# Patient Record
Sex: Female | Born: 1935
Health system: Southern US, Community
[De-identification: ages and names within clinical notes are randomized; demographics above are authoritative.]

---

## 2013-01-17 ENCOUNTER — Observation Stay: Payer: Self-pay | Admitting: Internal Medicine

## 2013-01-17 LAB — COMPREHENSIVE METABOLIC PANEL
ALBUMIN: 2.7 g/dL — AB (ref 3.4–5.0)
Alkaline Phosphatase: 118 U/L — ABNORMAL HIGH
Anion Gap: 3 — ABNORMAL LOW (ref 7–16)
BUN: 22 mg/dL — ABNORMAL HIGH (ref 7–18)
Bilirubin,Total: 0.2 mg/dL (ref 0.2–1.0)
CHLORIDE: 106 mmol/L (ref 98–107)
Calcium, Total: 8.7 mg/dL (ref 8.5–10.1)
Co2: 28 mmol/L (ref 21–32)
Creatinine: 1.38 mg/dL — ABNORMAL HIGH (ref 0.60–1.30)
EGFR (African American): 41 — ABNORMAL LOW
GFR CALC NON AF AMER: 35 — AB
Glucose: 108 mg/dL — ABNORMAL HIGH (ref 65–99)
Osmolality: 278 (ref 275–301)
POTASSIUM: 3.8 mmol/L (ref 3.5–5.1)
SGOT(AST): 28 U/L (ref 15–37)
SGPT (ALT): 17 U/L (ref 12–78)
Sodium: 137 mmol/L (ref 136–145)
Total Protein: 7.3 g/dL (ref 6.4–8.2)

## 2013-01-17 LAB — CBC
HCT: 35.4 % (ref 35.0–47.0)
HGB: 11.8 g/dL — AB (ref 12.0–16.0)
MCH: 31.5 pg (ref 26.0–34.0)
MCHC: 33.2 g/dL (ref 32.0–36.0)
MCV: 95 fL (ref 80–100)
PLATELETS: 208 10*3/uL (ref 150–440)
RBC: 3.73 10*6/uL — ABNORMAL LOW (ref 3.80–5.20)
RDW: 13.9 % (ref 11.5–14.5)
WBC: 7.4 10*3/uL (ref 3.6–11.0)

## 2013-01-17 LAB — TROPONIN I

## 2013-01-17 LAB — CK TOTAL AND CKMB (NOT AT ARMC)
CK, Total: 460 U/L — ABNORMAL HIGH (ref 21–215)
CK-MB: 0.8 ng/mL (ref 0.5–3.6)

## 2013-01-17 LAB — TSH: Thyroid Stimulating Horm: 1.08 u[IU]/mL

## 2013-01-18 DIAGNOSIS — R079 Chest pain, unspecified: Secondary | ICD-10-CM

## 2013-01-18 LAB — LIPID PANEL
Cholesterol: 133 mg/dL (ref 0–200)
HDL: 41 mg/dL (ref 40–60)
LDL CHOLESTEROL, CALC: 81 mg/dL (ref 0–100)
TRIGLYCERIDES: 54 mg/dL (ref 0–200)
VLDL CHOLESTEROL, CALC: 11 mg/dL (ref 5–40)

## 2013-01-18 LAB — URINALYSIS, COMPLETE
Bilirubin,UR: NEGATIVE
Blood: NEGATIVE
Glucose,UR: NEGATIVE mg/dL (ref 0–75)
Hyaline Cast: 3
Ketone: NEGATIVE
Leukocyte Esterase: NEGATIVE
NITRITE: NEGATIVE
PROTEIN: NEGATIVE
Ph: 5 (ref 4.5–8.0)
RBC,UR: 5 /HPF (ref 0–5)
Specific Gravity: 1.019 (ref 1.003–1.030)
WBC UR: 4 /HPF (ref 0–5)

## 2013-01-18 LAB — TROPONIN I
Troponin-I: 0.03 ng/mL
Troponin-I: 0.03 ng/mL

## 2013-01-18 LAB — BASIC METABOLIC PANEL
Anion Gap: 4 — ABNORMAL LOW (ref 7–16)
BUN: 20 mg/dL — ABNORMAL HIGH (ref 7–18)
CHLORIDE: 110 mmol/L — AB (ref 98–107)
CO2: 26 mmol/L (ref 21–32)
Calcium, Total: 8.6 mg/dL (ref 8.5–10.1)
Creatinine: 1.16 mg/dL (ref 0.60–1.30)
EGFR (Non-African Amer.): 44 — ABNORMAL LOW
GFR CALC AF AMER: 50 — AB
GLUCOSE: 88 mg/dL (ref 65–99)
Osmolality: 281 (ref 275–301)
POTASSIUM: 3.7 mmol/L (ref 3.5–5.1)
SODIUM: 140 mmol/L (ref 136–145)

## 2013-01-18 LAB — CK TOTAL AND CKMB (NOT AT ARMC)
CK, TOTAL: 366 U/L — AB (ref 21–215)
CK, Total: 439 U/L — ABNORMAL HIGH (ref 21–215)
CK-MB: 1 ng/mL (ref 0.5–3.6)
CK-MB: 1.1 ng/mL (ref 0.5–3.6)

## 2013-01-21 ENCOUNTER — Emergency Department: Payer: Self-pay | Admitting: Emergency Medicine

## 2013-01-21 LAB — CK TOTAL AND CKMB (NOT AT ARMC)
CK, Total: 208 U/L (ref 21–215)
CK-MB: 1 ng/mL (ref 0.5–3.6)

## 2013-01-21 LAB — COMPREHENSIVE METABOLIC PANEL
ALK PHOS: 120 U/L — AB
ALT: 20 U/L (ref 12–78)
Albumin: 2.8 g/dL — ABNORMAL LOW (ref 3.4–5.0)
Anion Gap: 3 — ABNORMAL LOW (ref 7–16)
BUN: 17 mg/dL (ref 7–18)
Bilirubin,Total: 0.2 mg/dL (ref 0.2–1.0)
CO2: 29 mmol/L (ref 21–32)
Calcium, Total: 9.2 mg/dL (ref 8.5–10.1)
Chloride: 102 mmol/L (ref 98–107)
Creatinine: 1.11 mg/dL (ref 0.60–1.30)
EGFR (African American): 53 — ABNORMAL LOW
EGFR (Non-African Amer.): 46 — ABNORMAL LOW
GLUCOSE: 78 mg/dL (ref 65–99)
OSMOLALITY: 269 (ref 275–301)
Potassium: 4.2 mmol/L (ref 3.5–5.1)
SGOT(AST): 34 U/L (ref 15–37)
Sodium: 134 mmol/L — ABNORMAL LOW (ref 136–145)
Total Protein: 7.6 g/dL (ref 6.4–8.2)

## 2013-01-21 LAB — CBC
HCT: 36.4 % (ref 35.0–47.0)
HGB: 12.2 g/dL (ref 12.0–16.0)
MCH: 31.5 pg (ref 26.0–34.0)
MCHC: 33.6 g/dL (ref 32.0–36.0)
MCV: 94 fL (ref 80–100)
PLATELETS: 229 10*3/uL (ref 150–440)
RBC: 3.87 10*6/uL (ref 3.80–5.20)
RDW: 13.9 % (ref 11.5–14.5)
WBC: 5.7 10*3/uL (ref 3.6–11.0)

## 2013-01-21 LAB — TROPONIN I: Troponin-I: <0.02 ng/mL

## 2014-05-08 NOTE — Discharge Summary (Signed)
PATIENT NAME:  Michelle Strong, Michelle Strong MR#:  [id number] DATE OF BIRTH:  1927/05/29  DATE OF ADMISSION:  01/17/2013 DATE OF DISCHARGE:  01/18/2013  PRIMARY CARE PHYSICIAN:  Nonlocal.    DISCHARGE DIAGNOSES:  Atypical chest pain, supraventricular tachycardia, acute renal failure, hiatal hernia, dementia.   CONDITION: Stable.   CODE STATUS: Full code.   HOME MEDICATIONS: Aspirin 81 mg p.o. daily.   The patient needs home health with nurse aide 24 hours' assistance according to physical therapy recommendations.     DIET: Regular diet.   ACTIVITY: As tolerated.   FOLLOWUP CARE: Follow with PCP within 1 to 2 weeks.   REASON FOR ADMISSION: Chest pain.   HOSPITAL COURSE: The patient is an 81 year old African American female with a history of dementia and bladder spasm, presented in the ED with chest pain and shortness of breath. The patient is demented.  She was noted to have SVT at 150, was treated with adenosine 60 mg 1 dose and converted to normal sinus rhythm. For detailed history and physical examination, please refer to the admission note dictated by Dr. Clint GuyHower. On admission date, the patient's BUN 22, creatinine 1.3. Troponin is negative. After admission, the patient's troponin has been negative for 3 sets. Lipid panel is normal. The patient has been treated with aspirin and a statin. The patient only has mild epigastric discomfort which is possibly due to hiatal hernia according to patient's chest x-ray.  1.  SVT improved after adenosine. 2.  Acute renal failure. The patient's renal function became normal after IV fluid support. Creatinine decreased from 1.38 to 1.16.  3.  Dementia and weakness. The patient is demented but has no complaints. Vital signs are stable. According to physical therapy evaluation, the patient needs 24 hours' assistance. The patient is clinically stable, will be discharged to home with home health today. I discussed the patient's discharge plan with patient's nurse.   TIME  SPENT: About 35 minutes.   ____________________________ Shaune PollackQing Laniqua Torrens, MD qc:cs D: 01/18/2013 15:31:03 ET T: 01/18/2013 20:13:55 ET JOB#: [id number]  cc: Shaune PollackQing Nyomie Ehrlich, MD, <Dictator> Shaune PollackQING Xana Bradt MD ELECTRONICALLY SIGNED 01/19/2013 16:53

## 2014-05-08 NOTE — H&P (Signed)
PATIENT NAME:  Michelle Strong, YUNG MR#:  [id number] DATE OF BIRTH:  12/18/1927  DATE OF ADMISSION:  01/17/2013  REFERRING PHYSICIAN:  Dr. Kerman Passey.   PRIMARY CARE PHYSICIAN:  [place] [facility] [place], nonlocal.   CHIEF COMPLAINT:  Chest pain.   HISTORY OF PRESENT ILLNESS:  An 81 year old African American female with history of dementia as well as bladder spasms presenting with chest pain and shortness of breath.  The patient unable to provide any reliable information, however per her description she describes the chest pain has been intermittent for weeks, however her family who are at bedside states that she had chest pain complaint only of today, left chest in location, nonradiating.  She described it only as discomfort, 6 out of 10 in intensity, worse with movement.  No relieving factors with associated shortness of breath and palpitations.  With the above symptoms called EMS.  On arrival to the Emergency Department found to be in SVT with heart rate in the 150s.  She was given adenosine 6 mg x 1 and converted to normal sinus rhythm.  Currently, the patient is without complaints and has no idea why she is in the hospital.   REVIEW OF SYSTEMS:  Unable to be reliably obtained secondary to patient's baseline mental status.   PAST MEDICAL HISTORY:  States only of bladder spasms.   SOCIAL HISTORY:  Positive for every day tobacco use.  Occasional alcohol usage.  Denies any drug usage.  Lives in [place] according to her.   FAMILY HISTORY:  Denies any knowledge of cardiovascular disease, but once again, the patient is demented, unsure how reliable this information is.   ALLERGIES:  No known allergies.   HOME MEDICATIONS:  None.   PHYSICAL EXAMINATION: VITAL SIGNS:  Temperature 98.2, heart rate 99, respirations 18, blood pressure 135/79, saturating 95% on room air.  Weight 39 kg, BMI of 15.8.  GENERAL:  Malnourished, frail-appearing African American female, currently in no acute distress.  HEAD:   Normocephalic, atraumatic.  EYES:  Pupils equal, round, reactive to light.  Extraocular muscles intact.  No scleral icterus.  MOUTH:  Dry mucosal membranes.  Dentition intact.  No abscess noted.  EAR, NOSE, THROAT:  Throat clear without exudates.  No external lesions.  NECK:  Supple.  No thyromegaly.  No nodules.  No JVD.  PULMONARY:  Clear to auscultation bilaterally with no wheezes, rubs or rhonchi.  No use of accessory muscles.  Good respiratory effort.  CHEST:  Nontender to palpation.  CARDIOVASCULAR:  S1, S2, regular rate and rhythm.  No murmurs, rubs, or gallops.  No edema.  Pedal pulses 2+ bilaterally.  GASTROINTESTINAL:  Soft, nontender, nondistended.  No masses.  Positive bowel sounds.  No hepatosplenomegaly.  MUSCULOSKELETAL:  No swelling, clubbing, edema.  Range of motion full in all extremities.  NEUROLOGIC:  Cranial nerves II through XII intact.  No gross focal neurological deficits.  Sensation intact.  Reflexes intact.  SKIN:  No ulcerations, lesions, rashes or cyanosis.  Skin warm, dry.  Turgor is intact.  PSYCHIATRIC:  Mood and affect within normal limits.  She is awake, alert to person, however not to place or time.  She has a circumferential speech pattern.  Insight and judgment poor at best.   LABORATORY DATA:  Sodium 137, potassium 3.8, chloride 106, bicarb 28, BUN 22, creatinine 1.3, glucose 108, albumin 2.7, alk phos 118.  Remainder of LFTs within normal limits.  CK 460, CK-MB 0.8, troponin 0.02.  WBC 7.4, hemoglobin 11.8, platelets 208.  Initial EKG on arrival reveals narrow complex tachycardia, heart rate of 150s.  After adenosine, sinus tachycardia, heart rate 122 with LVH.  There is repolarization abnormality in leads V5 and 6.   ASSESSMENT AND PLAN:  An 81 year old African American female with history of dementia and bladder spasms, presenting with chest pain, shortness of breath, found to be in supraventricular tachycardia.  1.  Chest pain.  Admit to observation under  telemetry.  Trend cardiac enzymes.  Check her lipids.  She has been given aspirin.  Continue that, as well as start a statin therapy.  2.  Supraventricular tachycardia.  She is currently in normal sinus rhythm after 6 mg of adenosine x 1.  We will check a TSH and an echocardiogram as well as provide IV fluid hydration.  3.  Acute kidney fluid, provide IV fluid hydration with normal saline.  4.  VTE prophylaxis with heparin subQ.  5.  CODE STATUS:  THE PATIENT IS FULL CODE.   TIME SPENT:  45 minutes.    ____________________________ Aaron Mose. Hower, MD dkh:ea D: 01/17/2013 22:57:54 ET T: 01/17/2013 23:32:22 ET JOB#: [id number]  cc: Aaron Mose. Hower, MD, <Dictator> DAVID Woodfin Ganja MD ELECTRONICALLY SIGNED 01/18/2013 0:55

## 2015-01-10 IMAGING — CR DG CHEST 1V PORT
1 series · 1 of 1 positions shown · non-contrast
Comparison: None.

CLINICAL DATA: Chest pain

EXAM:
PORTABLE CHEST - 1 VIEW

[ap]
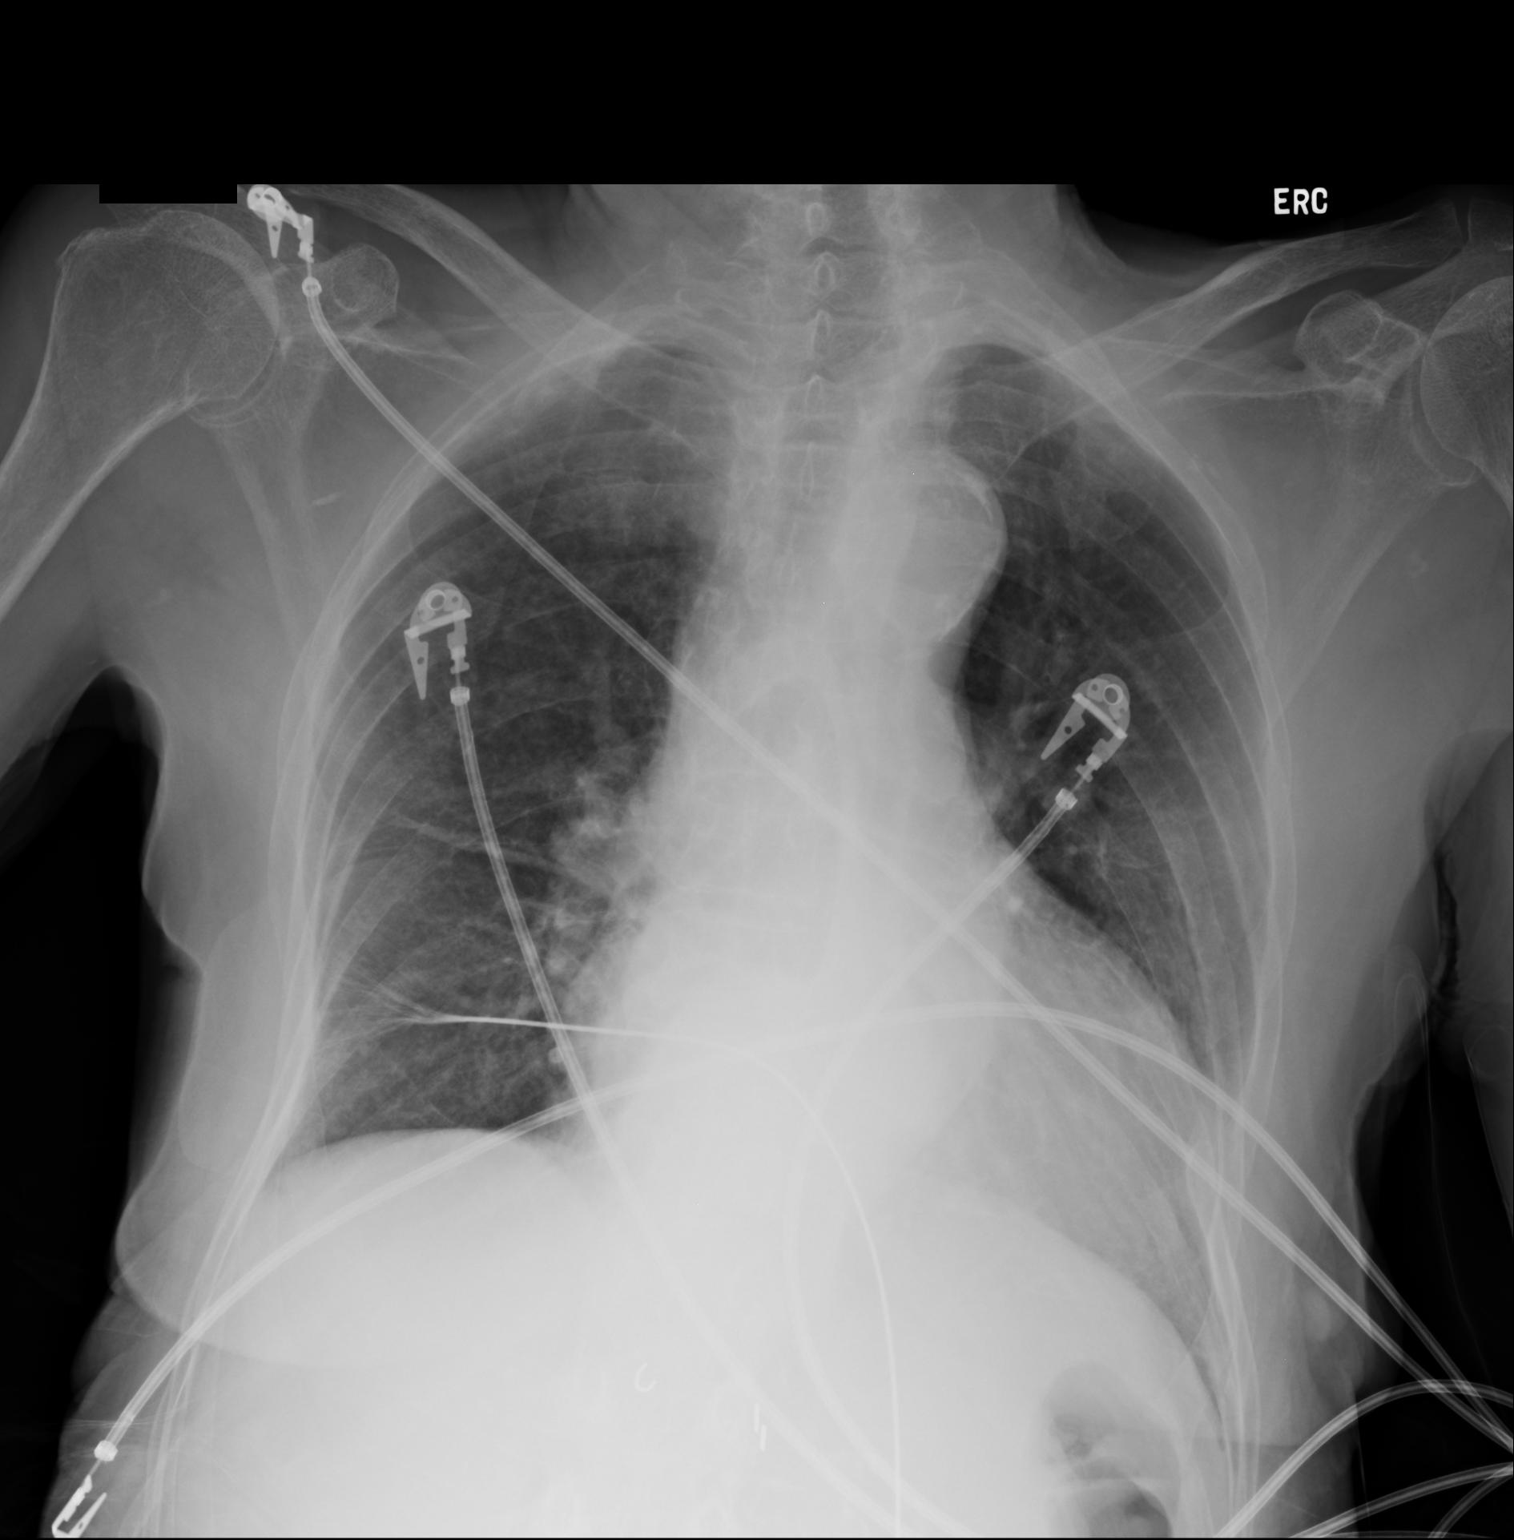

[1 of 1 positions shown; findings below may reference images not displayed]

FINDINGS: Cardiomegaly. The aortic is tortuous. Fusiform widening the level
the lower chest is usually from hiatal hernia, especially since it
appears discrete from the neighboring aorta.

Hyperinflated lungs. No edema, infiltrate, effusion, or
pneumothorax. Biapical pleural parenchymal thickening/scarring.
Remote appearing lower left rib fractures.
IMPRESSION: 1.  No active disease.
2. Lower mediastinal abnormality which usually represents hiatal
hernia. Lateral radiography might be confirmatory.

## 2017-11-15 DEATH — deceased
